# Patient Record
Sex: Female | Born: 1985 | Race: Black or African American | Hispanic: No | Marital: Single
Health system: Southern US, Community
[De-identification: ages and names within clinical notes are randomized; demographics above are authoritative.]

## PROBLEM LIST (undated history)

## (undated) DIAGNOSIS — E119 Type 2 diabetes mellitus without complications: Secondary | ICD-10-CM

---

## 2006-05-17 ENCOUNTER — Emergency Department (HOSPITAL_COMMUNITY): Admission: EM | Admit: 2006-05-17 | Discharge: 2006-05-18 | Payer: Self-pay | Admitting: Emergency Medicine

## 2010-01-14 ENCOUNTER — Inpatient Hospital Stay (HOSPITAL_COMMUNITY): Admission: AD | Admit: 2010-01-14 | Discharge: 2010-01-14 | Payer: Self-pay | Admitting: Obstetrics & Gynecology

## 2010-01-21 ENCOUNTER — Encounter
Admission: RE | Admit: 2010-01-21 | Discharge: 2010-04-21 | Payer: Self-pay | Source: Home / Self Care | Admitting: Obstetrics & Gynecology

## 2010-01-21 ENCOUNTER — Ambulatory Visit: Payer: Self-pay | Admitting: Obstetrics & Gynecology

## 2010-01-21 LAB — CONVERTED CEMR LAB: Yeast Wet Prep HPF POC: NONE SEEN

## 2010-01-25 ENCOUNTER — Ambulatory Visit: Payer: Self-pay | Admitting: Obstetrics & Gynecology

## 2010-01-25 LAB — CONVERTED CEMR LAB
ALT: 11 units/L (ref 0–35)
Alkaline Phosphatase: 57 units/L (ref 39–117)
Antibody Screen: NEGATIVE
Collection Interval-CRCL: 24 hr
Creatinine Clearance: 286 mL/min — ABNORMAL HIGH (ref 75–115)
Creatinine, Ser: 0.55 mg/dL (ref 0.40–1.20)
Creatinine, Urine: 137.3 mg/dL
Eosinophils Absolute: 0.1 10*3/uL (ref 0.0–0.7)
Eosinophils Relative: 1 % (ref 0–5)
HCT: 35.1 % — ABNORMAL LOW (ref 36.0–46.0)
Hemoglobin: 11.7 g/dL — ABNORMAL LOW (ref 12.0–15.0)
Hgb F Quant: 0 % (ref 0.0–2.0)
Hgb S Quant: 0 % (ref 0.0–0.0)
Lymphocytes Relative: 19 % (ref 12–46)
Lymphs Abs: 1.7 10*3/uL (ref 0.7–4.0)
MCV: 81.4 fL (ref 78.0–100.0)
Monocytes Absolute: 0.6 10*3/uL (ref 0.1–1.0)
Monocytes Relative: 6 % (ref 3–12)
Platelets: 293 10*3/uL (ref 150–400)
Protein, Ur: 83 mg/24hr (ref 50–100)
RBC: 4.31 M/uL (ref 3.87–5.11)
Rh Type: POSITIVE
Rubella: 39.2 intl units/mL — ABNORMAL HIGH
Sodium: 136 meq/L (ref 135–145)
Total Bilirubin: 0.4 mg/dL (ref 0.3–1.2)
Total Protein: 6.4 g/dL (ref 6.0–8.3)
WBC: 8.8 10*3/uL (ref 4.0–10.5)

## 2010-01-28 ENCOUNTER — Ambulatory Visit: Payer: Self-pay | Admitting: Obstetrics & Gynecology

## 2010-01-31 ENCOUNTER — Ambulatory Visit (HOSPITAL_COMMUNITY): Admission: RE | Admit: 2010-01-31 | Discharge: 2010-01-31 | Payer: Self-pay | Admitting: Obstetrics & Gynecology

## 2010-02-11 ENCOUNTER — Ambulatory Visit: Payer: Self-pay | Admitting: Obstetrics & Gynecology

## 2010-02-25 ENCOUNTER — Ambulatory Visit: Payer: Self-pay | Admitting: Obstetrics & Gynecology

## 2010-03-11 ENCOUNTER — Ambulatory Visit: Payer: Self-pay | Admitting: Obstetrics & Gynecology

## 2010-03-12 ENCOUNTER — Encounter: Payer: Self-pay | Admitting: Family

## 2010-03-21 ENCOUNTER — Ambulatory Visit (HOSPITAL_COMMUNITY): Admission: RE | Admit: 2010-03-21 | Discharge: 2010-03-21 | Payer: Self-pay | Admitting: Obstetrics & Gynecology

## 2010-03-28 ENCOUNTER — Ambulatory Visit: Payer: Self-pay | Admitting: Family Medicine

## 2010-04-11 ENCOUNTER — Ambulatory Visit: Payer: Self-pay | Admitting: Family Medicine

## 2010-04-22 ENCOUNTER — Ambulatory Visit (HOSPITAL_COMMUNITY): Admission: RE | Admit: 2010-04-22 | Discharge: 2010-04-22 | Payer: Self-pay | Admitting: Obstetrics & Gynecology

## 2010-05-09 ENCOUNTER — Ambulatory Visit: Payer: Self-pay | Admitting: Obstetrics & Gynecology

## 2010-05-21 ENCOUNTER — Ambulatory Visit: Payer: Self-pay | Admitting: Family Medicine

## 2010-05-21 ENCOUNTER — Inpatient Hospital Stay (HOSPITAL_COMMUNITY): Admission: AD | Admit: 2010-05-21 | Discharge: 2010-05-22 | Payer: Self-pay | Admitting: Obstetrics & Gynecology

## 2010-05-23 ENCOUNTER — Ambulatory Visit: Payer: Self-pay | Admitting: Obstetrics & Gynecology

## 2010-06-03 ENCOUNTER — Ambulatory Visit (HOSPITAL_COMMUNITY): Admission: RE | Admit: 2010-06-03 | Discharge: 2010-06-03 | Payer: Self-pay | Admitting: Obstetrics & Gynecology

## 2010-06-06 ENCOUNTER — Encounter: Payer: Self-pay | Admitting: Obstetrics and Gynecology

## 2010-06-06 ENCOUNTER — Ambulatory Visit: Payer: Self-pay | Admitting: Family Medicine

## 2010-06-06 LAB — CONVERTED CEMR LAB
HCT: 34.4 % — ABNORMAL LOW (ref 36.0–46.0)
Hemoglobin: 11.3 g/dL — ABNORMAL LOW (ref 12.0–15.0)
RBC: 4.23 M/uL (ref 3.87–5.11)
RDW: 14.6 % (ref 11.5–15.5)

## 2010-06-17 ENCOUNTER — Ambulatory Visit: Payer: Self-pay | Admitting: Obstetrics & Gynecology

## 2010-06-24 ENCOUNTER — Ambulatory Visit: Payer: Self-pay | Admitting: Obstetrics & Gynecology

## 2010-07-01 ENCOUNTER — Ambulatory Visit: Payer: Self-pay | Admitting: Obstetrics and Gynecology

## 2010-07-01 ENCOUNTER — Encounter: Admission: RE | Admit: 2010-07-01 | Discharge: 2010-07-01 | Payer: Self-pay | Admitting: Obstetrics & Gynecology

## 2010-07-04 ENCOUNTER — Ambulatory Visit: Payer: Self-pay | Admitting: Obstetrics and Gynecology

## 2010-07-08 ENCOUNTER — Ambulatory Visit: Payer: Self-pay | Admitting: Obstetrics & Gynecology

## 2010-07-11 ENCOUNTER — Ambulatory Visit: Payer: Self-pay | Admitting: Obstetrics & Gynecology

## 2010-07-15 ENCOUNTER — Ambulatory Visit: Payer: Self-pay | Admitting: Obstetrics & Gynecology

## 2010-07-15 ENCOUNTER — Ambulatory Visit (HOSPITAL_COMMUNITY): Admission: RE | Admit: 2010-07-15 | Discharge: 2010-07-15 | Payer: Self-pay | Admitting: Obstetrics & Gynecology

## 2010-07-18 ENCOUNTER — Ambulatory Visit: Payer: Self-pay | Admitting: Obstetrics and Gynecology

## 2010-07-22 ENCOUNTER — Encounter: Payer: Self-pay | Admitting: Family Medicine

## 2010-07-22 ENCOUNTER — Ambulatory Visit: Payer: Self-pay | Admitting: Obstetrics & Gynecology

## 2010-07-23 ENCOUNTER — Encounter: Payer: Self-pay | Admitting: Family Medicine

## 2010-07-23 LAB — CONVERTED CEMR LAB: Chlamydia, DNA Probe: NEGATIVE

## 2010-07-25 ENCOUNTER — Ambulatory Visit: Payer: Self-pay | Admitting: Obstetrics and Gynecology

## 2010-07-29 ENCOUNTER — Ambulatory Visit: Payer: Self-pay | Admitting: Obstetrics & Gynecology

## 2010-08-01 ENCOUNTER — Ambulatory Visit: Payer: Self-pay | Admitting: Obstetrics and Gynecology

## 2010-08-06 ENCOUNTER — Ambulatory Visit: Payer: Self-pay | Admitting: Obstetrics & Gynecology

## 2010-08-06 ENCOUNTER — Ambulatory Visit (HOSPITAL_COMMUNITY): Admission: RE | Admit: 2010-08-06 | Discharge: 2010-08-06 | Payer: Self-pay | Admitting: Family Medicine

## 2010-08-09 ENCOUNTER — Ambulatory Visit: Payer: Self-pay | Admitting: Obstetrics and Gynecology

## 2010-08-11 ENCOUNTER — Ambulatory Visit: Payer: Self-pay | Admitting: Obstetrics & Gynecology

## 2010-08-11 ENCOUNTER — Inpatient Hospital Stay (HOSPITAL_COMMUNITY)
Admission: AD | Admit: 2010-08-11 | Discharge: 2010-08-14 | Payer: Self-pay | Source: Home / Self Care | Admitting: Obstetrics & Gynecology

## 2010-09-09 ENCOUNTER — Ambulatory Visit: Payer: Self-pay | Admitting: Obstetrics & Gynecology

## 2010-10-13 ENCOUNTER — Encounter: Payer: Self-pay | Admitting: Obstetrics & Gynecology

## 2010-10-31 ENCOUNTER — Ambulatory Visit: Payer: Self-pay

## 2010-12-03 LAB — GLUCOSE, CAPILLARY
Glucose-Capillary: 102 mg/dL — ABNORMAL HIGH (ref 70–99)
Glucose-Capillary: 109 mg/dL — ABNORMAL HIGH (ref 70–99)
Glucose-Capillary: 131 mg/dL — ABNORMAL HIGH (ref 70–99)
Glucose-Capillary: 134 mg/dL — ABNORMAL HIGH (ref 70–99)
Glucose-Capillary: 137 mg/dL — ABNORMAL HIGH (ref 70–99)
Glucose-Capillary: 160 mg/dL — ABNORMAL HIGH (ref 70–99)
Glucose-Capillary: 83 mg/dL (ref 70–99)
Glucose-Capillary: 94 mg/dL (ref 70–99)
Glucose-Capillary: 97 mg/dL (ref 70–99)

## 2010-12-03 LAB — POCT URINALYSIS DIPSTICK
Glucose, UA: NEGATIVE mg/dL
Ketones, ur: NEGATIVE mg/dL
Nitrite: NEGATIVE
pH: 7 (ref 5.0–8.0)

## 2010-12-03 LAB — CBC
HCT: 32.8 % — ABNORMAL LOW (ref 36.0–46.0)
Hemoglobin: 11.1 g/dL — ABNORMAL LOW (ref 12.0–15.0)
RBC: 4.02 MIL/uL (ref 3.87–5.11)
WBC: 8.6 10*3/uL (ref 4.0–10.5)

## 2010-12-03 LAB — RPR: RPR Ser Ql: NONREACTIVE

## 2010-12-04 LAB — POCT URINALYSIS DIPSTICK
Glucose, UA: NEGATIVE mg/dL
Hgb urine dipstick: NEGATIVE
Ketones, ur: 80 mg/dL — AB
Ketones, ur: NEGATIVE mg/dL
Nitrite: NEGATIVE
Specific Gravity, Urine: 1.02 (ref 1.005–1.030)
pH: 6 (ref 5.0–8.0)

## 2010-12-05 LAB — POCT URINALYSIS DIPSTICK
Bilirubin Urine: NEGATIVE
Bilirubin Urine: NEGATIVE
Glucose, UA: NEGATIVE mg/dL
Hgb urine dipstick: NEGATIVE
Hgb urine dipstick: NEGATIVE
Hgb urine dipstick: NEGATIVE
Ketones, ur: NEGATIVE mg/dL
Ketones, ur: NEGATIVE mg/dL
Ketones, ur: NEGATIVE mg/dL
Protein, ur: NEGATIVE mg/dL
Protein, ur: NEGATIVE mg/dL
Protein, ur: NEGATIVE mg/dL
Specific Gravity, Urine: 1.025 (ref 1.005–1.030)
Specific Gravity, Urine: 1.025 (ref 1.005–1.030)
Specific Gravity, Urine: 1.02 (ref 1.005–1.030)
Specific Gravity, Urine: 1.02 (ref 1.005–1.030)
Specific Gravity, Urine: 1.02 (ref 1.005–1.030)
Urobilinogen, UA: 1 mg/dL (ref 0.0–1.0)
Urobilinogen, UA: 1 mg/dL (ref 0.0–1.0)
pH: 6 (ref 5.0–8.0)
pH: 6.5 (ref 5.0–8.0)

## 2010-12-06 LAB — GC/CHLAMYDIA PROBE AMP, GENITAL
Chlamydia, DNA Probe: NEGATIVE
GC Probe Amp, Genital: NEGATIVE

## 2010-12-06 LAB — URINE CULTURE: Culture  Setup Time: 201108310532

## 2010-12-06 LAB — URINALYSIS, ROUTINE W REFLEX MICROSCOPIC
Bilirubin Urine: NEGATIVE
Glucose, UA: NEGATIVE mg/dL
Specific Gravity, Urine: >1.03 — ABNORMAL HIGH (ref 1.005–1.030)
Urobilinogen, UA: 0.2 mg/dL (ref 0.0–1.0)

## 2010-12-06 LAB — URINE MICROSCOPIC-ADD ON

## 2010-12-06 LAB — POCT URINALYSIS DIPSTICK
Nitrite: NEGATIVE
Urobilinogen, UA: 2 mg/dL — ABNORMAL HIGH (ref 0.0–1.0)
pH: 6 (ref 5.0–8.0)

## 2010-12-06 LAB — GLUCOSE, CAPILLARY: Glucose-Capillary: 87 mg/dL (ref 70–99)

## 2010-12-06 LAB — WET PREP, GENITAL
Trich, Wet Prep: NONE SEEN
Yeast Wet Prep HPF POC: NONE SEEN

## 2010-12-07 LAB — POCT URINALYSIS DIP (DEVICE)
Hgb urine dipstick: NEGATIVE
Nitrite: NEGATIVE
Protein, ur: NEGATIVE mg/dL
Urobilinogen, UA: 2 mg/dL — ABNORMAL HIGH (ref 0.0–1.0)
pH: 7.5 (ref 5.0–8.0)

## 2010-12-08 LAB — POCT URINALYSIS DIP (DEVICE)
Glucose, UA: NEGATIVE mg/dL
Nitrite: NEGATIVE
Nitrite: NEGATIVE
Protein, ur: NEGATIVE mg/dL
Protein, ur: 30 mg/dL — AB
Urobilinogen, UA: 1 mg/dL (ref 0.0–1.0)
Urobilinogen, UA: 0.2 mg/dL (ref 0.0–1.0)
pH: 7 (ref 5.0–8.0)

## 2010-12-09 LAB — POCT URINALYSIS DIP (DEVICE)
Bilirubin Urine: NEGATIVE
Bilirubin Urine: NEGATIVE
Glucose, UA: NEGATIVE mg/dL
Ketones, ur: NEGATIVE mg/dL
Nitrite: NEGATIVE
Nitrite: NEGATIVE
Protein, ur: 30 mg/dL — AB
Urobilinogen, UA: 1 mg/dL (ref 0.0–1.0)
pH: 5.5 (ref 5.0–8.0)
pH: 5.5 (ref 5.0–8.0)

## 2010-12-10 LAB — COMPREHENSIVE METABOLIC PANEL
ALT: 12 U/L (ref 0–35)
BUN: 6 mg/dL (ref 6–23)
CO2: 24 mEq/L (ref 19–32)
Calcium: 8.9 mg/dL (ref 8.4–10.5)
Creatinine, Ser: 0.57 mg/dL (ref 0.4–1.2)
GFR calc non Af Amer: >60 mL/min (ref >60)
Glucose, Bld: 122 mg/dL — ABNORMAL HIGH (ref 70–99)

## 2010-12-10 LAB — GLUCOSE, CAPILLARY
Glucose-Capillary: 75 mg/dL (ref 70–99)
Glucose-Capillary: 149 mg/dL — ABNORMAL HIGH (ref 70–99)

## 2010-12-10 LAB — CBC
Hemoglobin: 12.1 g/dL (ref 12.0–15.0)
MCHC: 34.5 g/dL (ref 30.0–36.0)
MCV: 83 fL (ref 78.0–100.0)
RBC: 4.21 MIL/uL (ref 3.87–5.11)
RDW: 13.9 % (ref 11.5–15.5)

## 2010-12-10 LAB — POCT URINALYSIS DIP (DEVICE)
Glucose, UA: NEGATIVE mg/dL
Glucose, UA: NEGATIVE mg/dL
Nitrite: NEGATIVE
Protein, ur: NEGATIVE mg/dL
Protein, ur: NEGATIVE mg/dL
Specific Gravity, Urine: 1.02 (ref 1.005–1.030)
Urobilinogen, UA: 1 mg/dL (ref 0.0–1.0)
Urobilinogen, UA: 1 mg/dL (ref 0.0–1.0)

## 2010-12-10 LAB — URINALYSIS, ROUTINE W REFLEX MICROSCOPIC
Ketones, ur: NEGATIVE mg/dL
Nitrite: NEGATIVE
Protein, ur: NEGATIVE mg/dL
Urobilinogen, UA: 1 mg/dL (ref 0.0–1.0)

## 2010-12-10 LAB — URINE MICROSCOPIC-ADD ON

## 2011-03-06 ENCOUNTER — Ambulatory Visit: Payer: Self-pay | Admitting: Physician Assistant

## 2012-04-04 IMAGING — US US OB COMP LESS 14 WK
1 series · 14 of 28 positions shown · non-contrast
Comparison: none

OBSTETRICAL ULTRASOUND:
 This ultrasound exam was performed in the [HOSPITAL] Ultrasound Department.  The OB US report was generated in the AS system, and faxed to the ordering physician.  This report is also available in [HOSPITAL]?s Access[employer]ware and in [REDACTED] PACS.

[Series 1: us ob comp less 14 wks · 0.17mm/px · 14 of 31 slices shown]
[im 2/31]
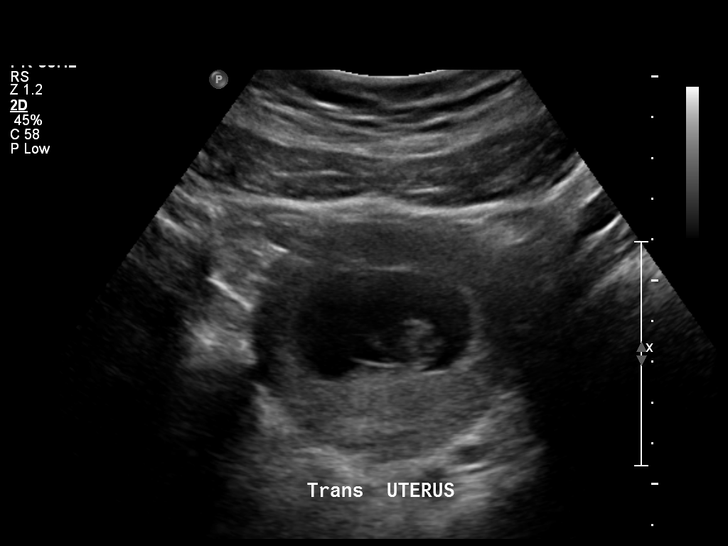
[im 4/31]
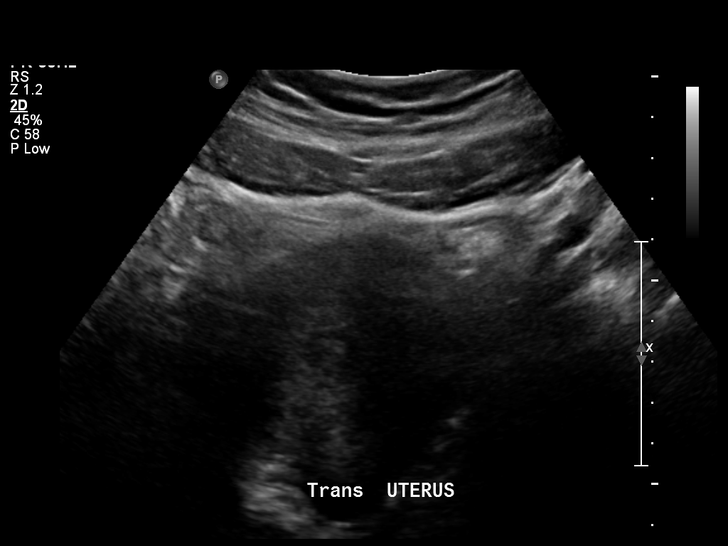
[im 6/31]
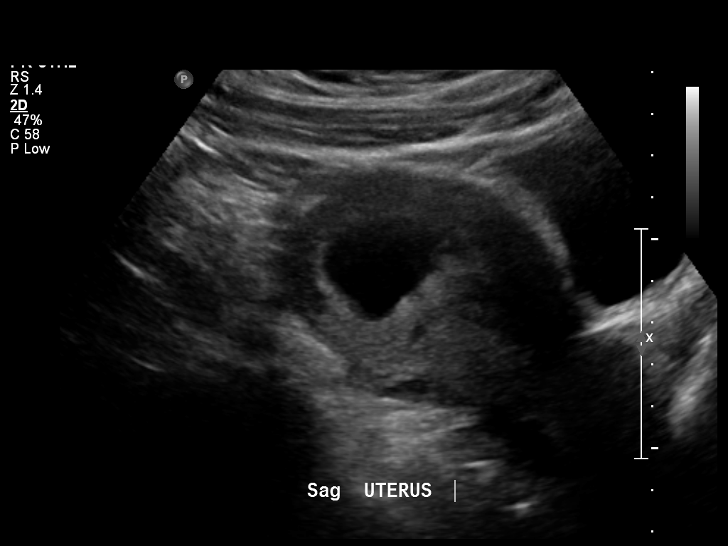
[im 8/31]
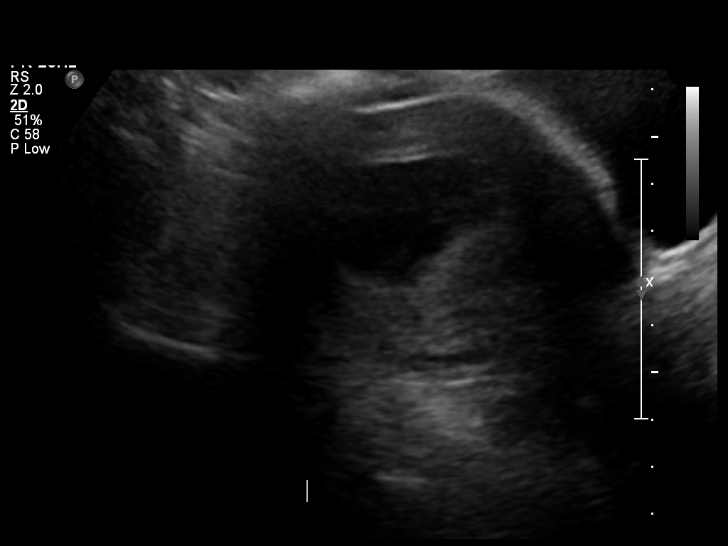
[im 11/31]
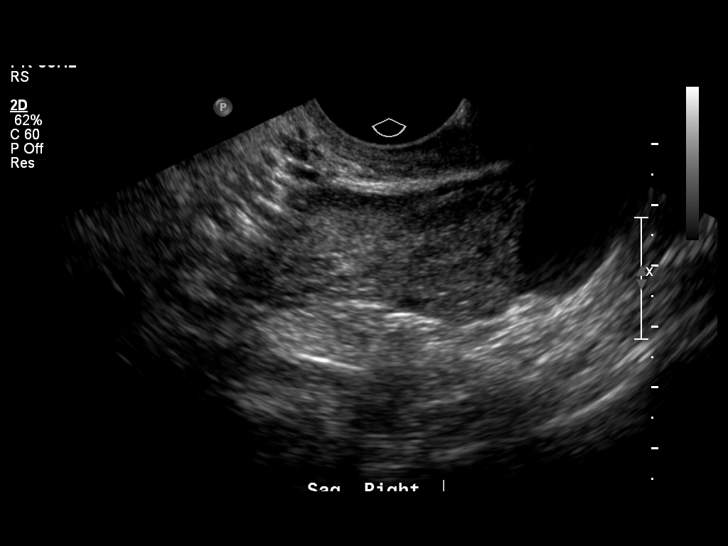
[im 13/31]
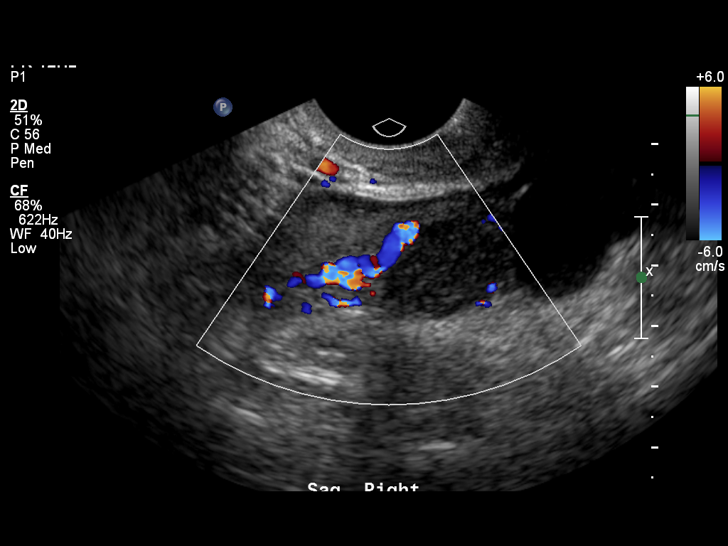
[im 15/31]
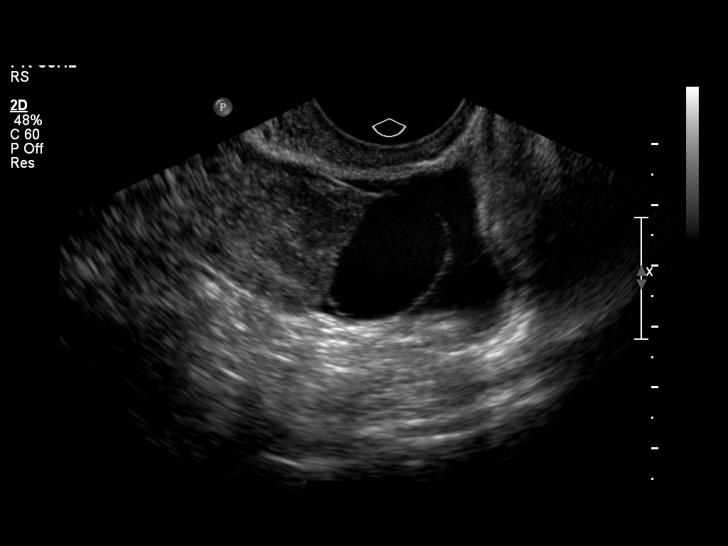
[im 17/31]
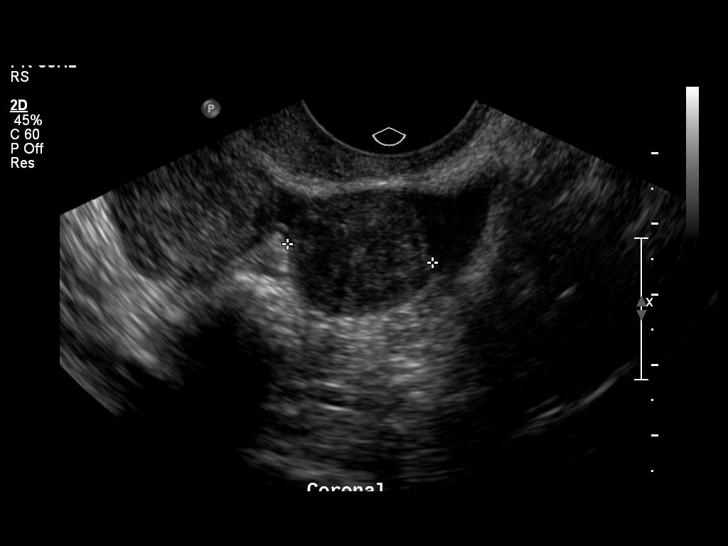
[im 19/31]
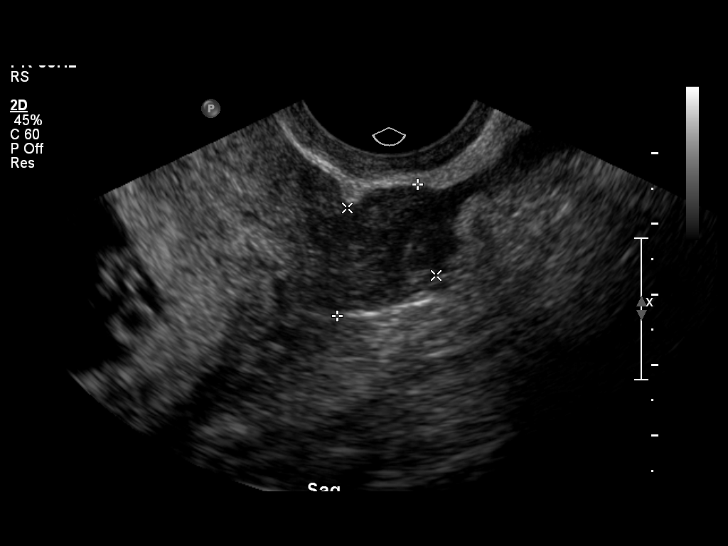
[im 22/31]
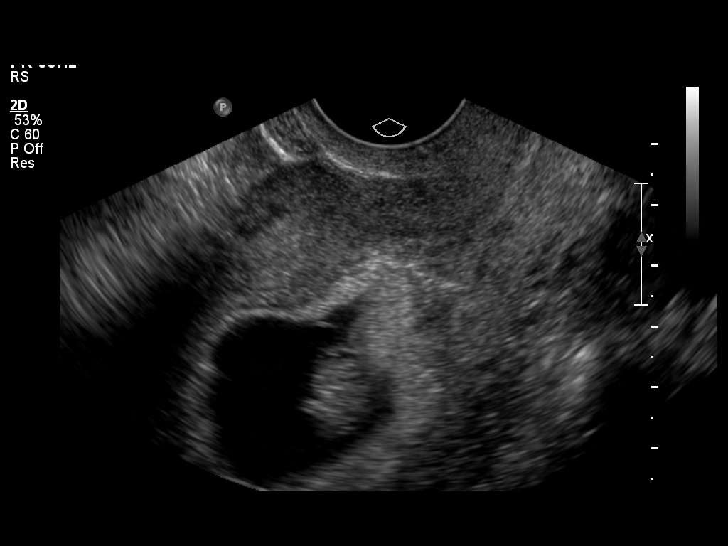
[im 24/31]
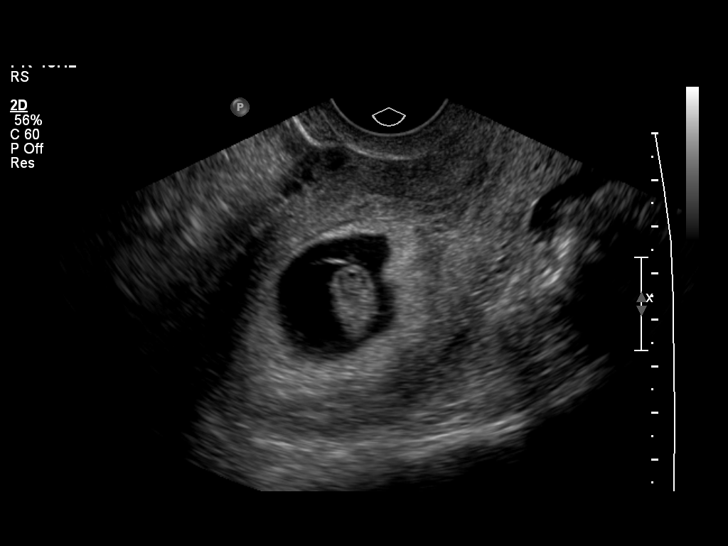
[im 26/31]
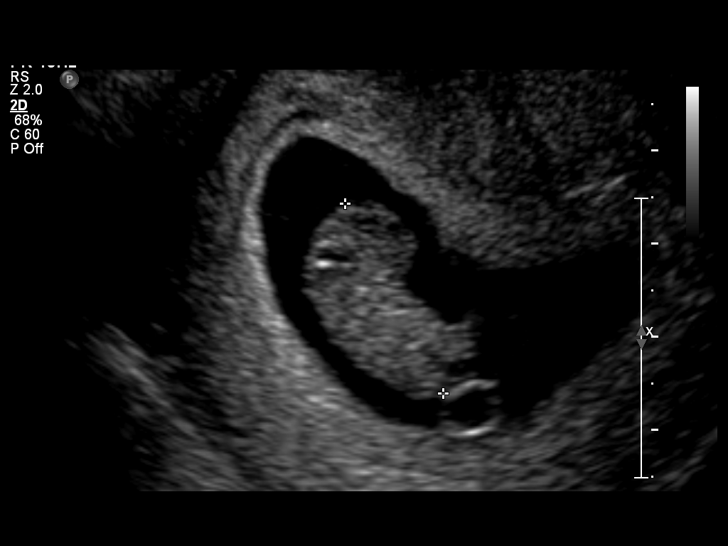
[im 28/31]
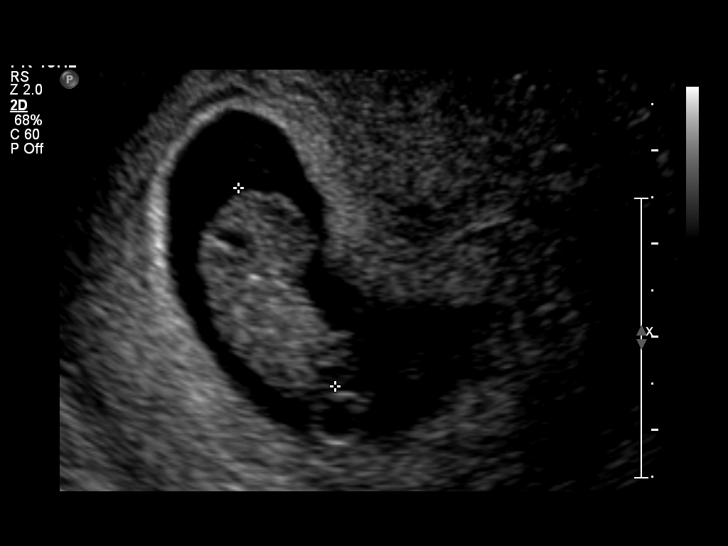
[im 31/31]
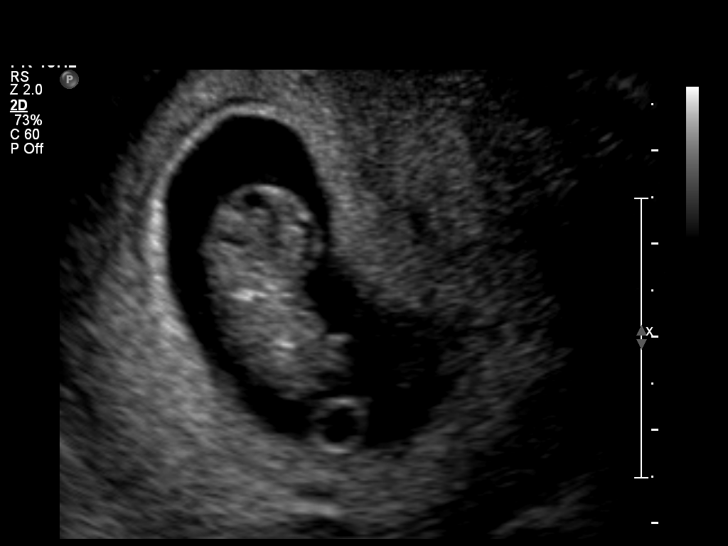

[14 of 28 positions shown; findings below may reference images not displayed]

IMPRESSION: See AS Obstetric US report.

## 2012-07-11 IMAGING — US US OB FOLLOW-UP
2 series · 14 of 28 positions shown · non-contrast
Comparison: none

OBSTETRICAL ULTRASOUND:
 This ultrasound was performed in The [HOSPITAL], and the [facility] OB/GYN report will be stored to [REDACTED] PACS.  This report is also available in [HOSPITAL]?s accessANYware.

[Series 1: us ob follow-up · 3 of 14 slices shown (1 of 2)]
[im 3/14]
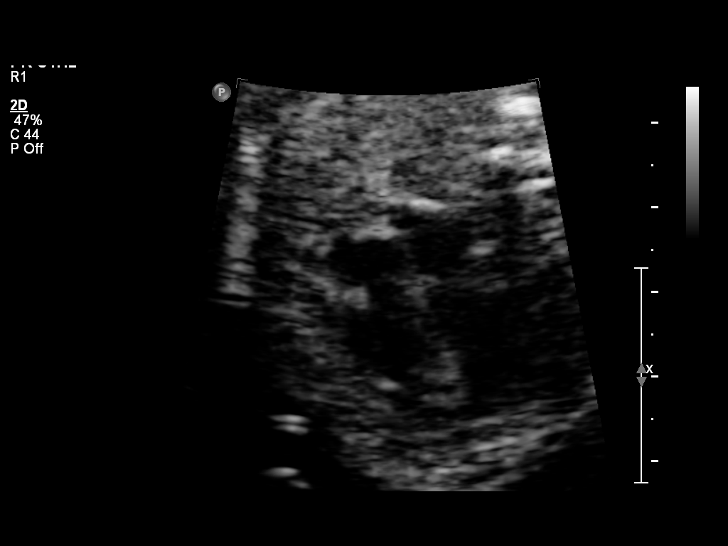
[im 7/14]
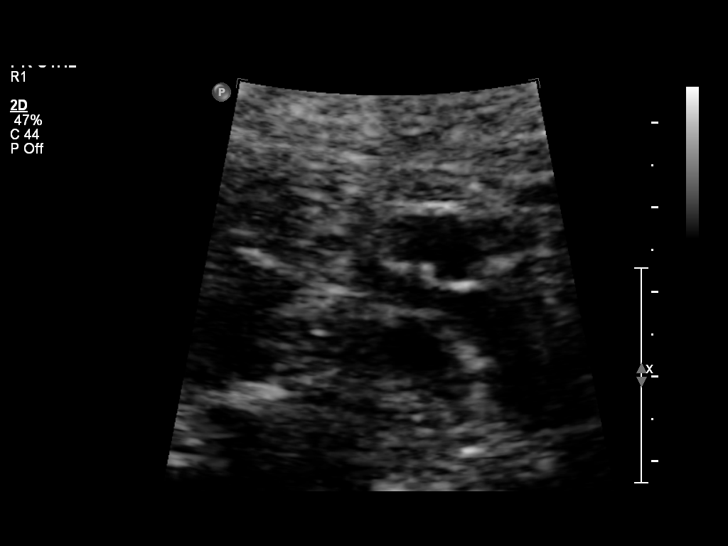
[im 11/14]
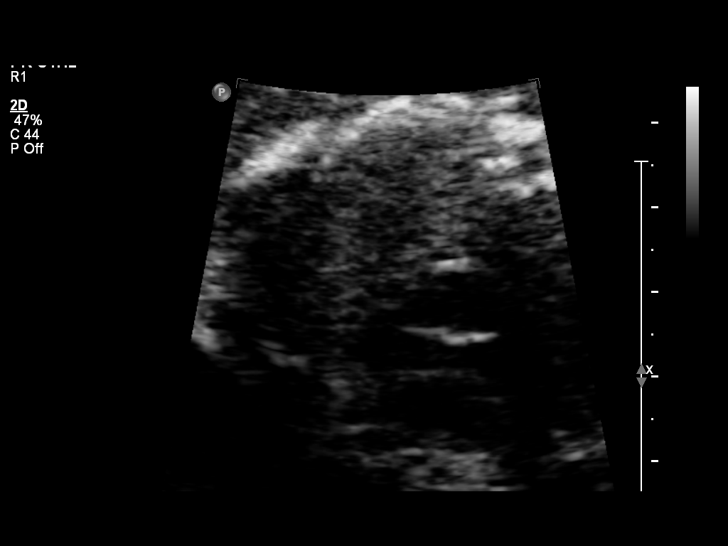

[Series 1: us ob follow-up · 39 acquisitions, 11 frames shown (2 of 2)]
[im 1/39]
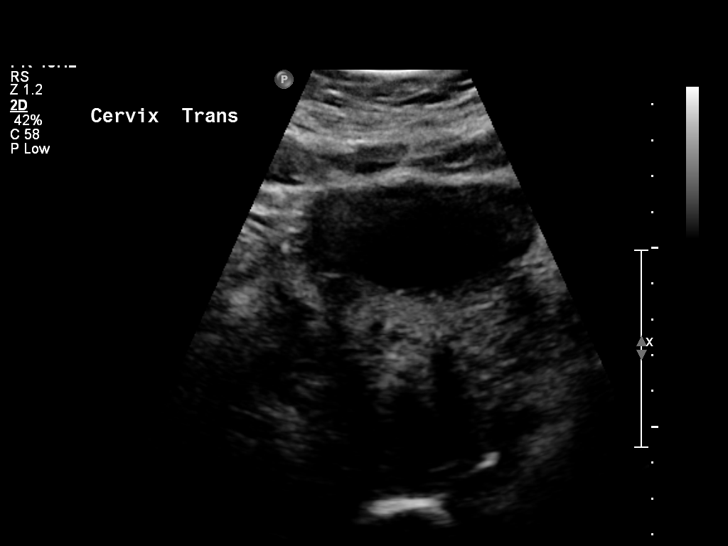
[im 4/39]
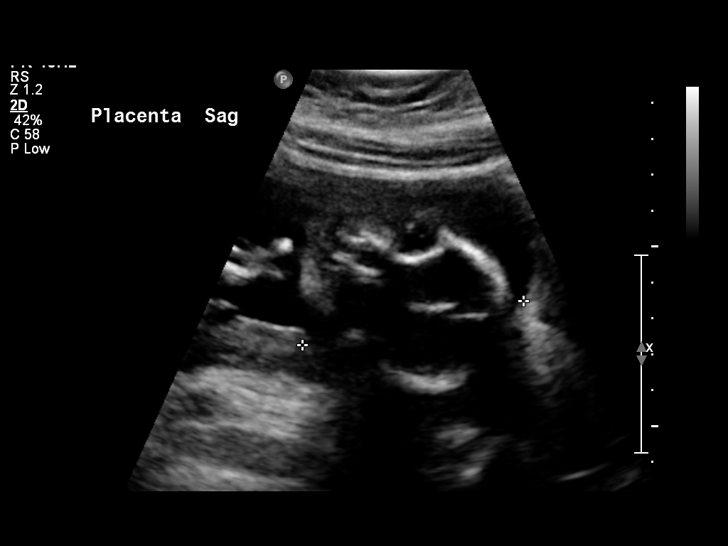
[im 8/39]
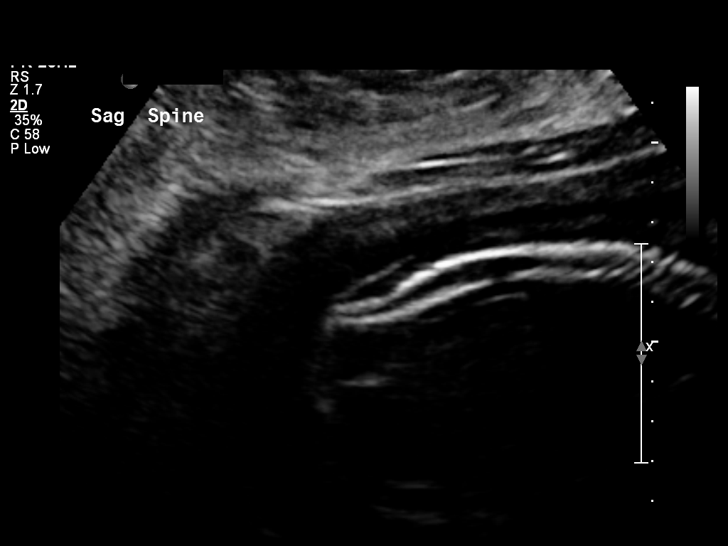
[im 12/39]
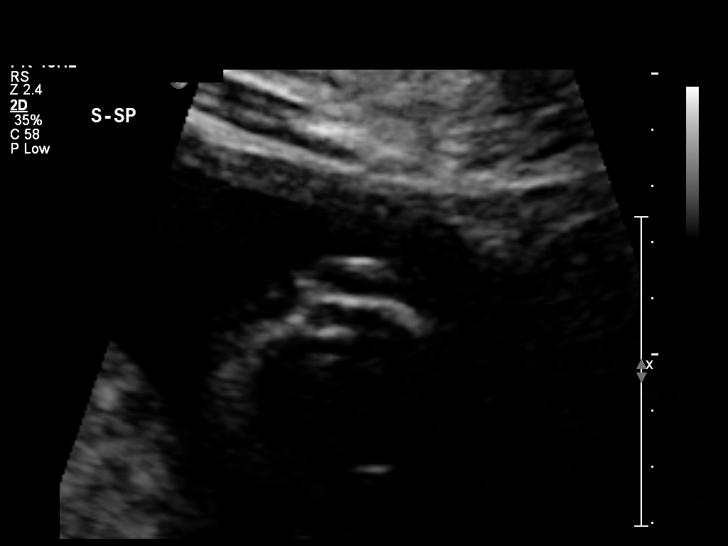
[im 16/39]
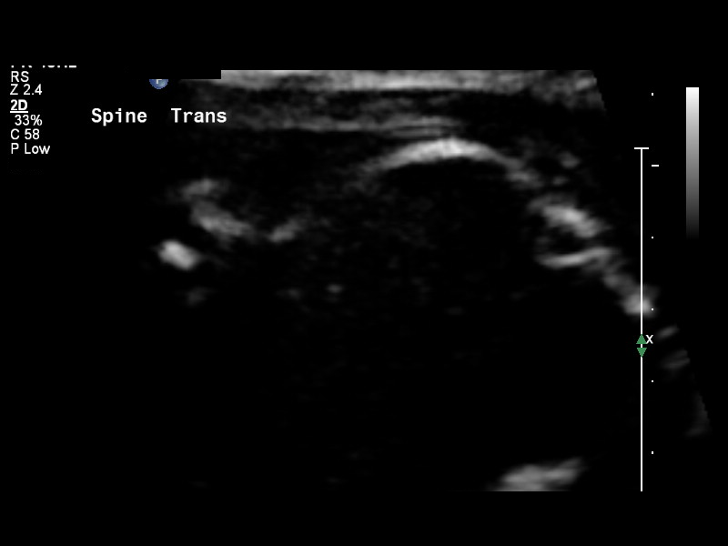
[im 20/39]
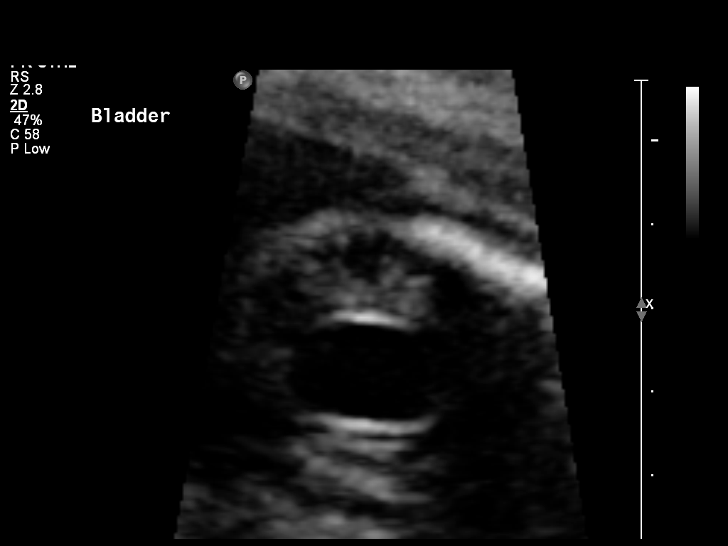
[im 23/39]
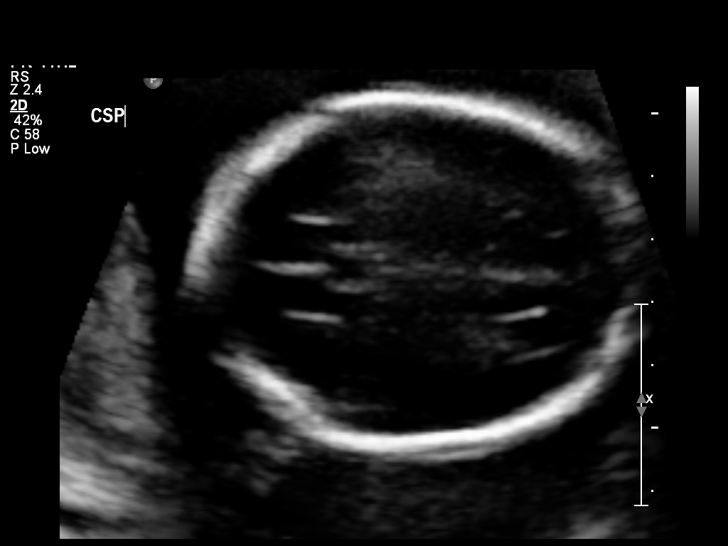
[im 27/39]
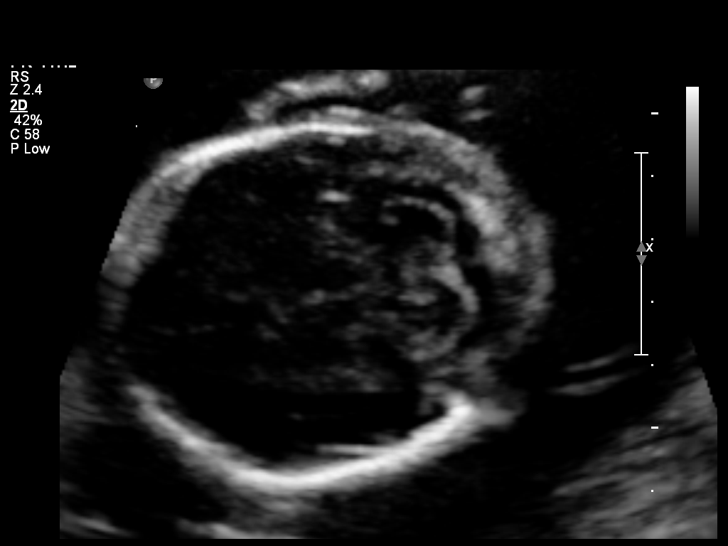
[im 31/39]
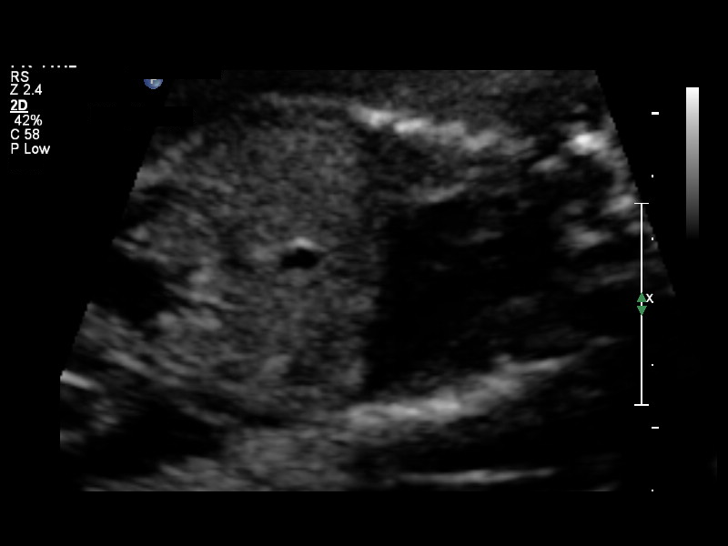
[im 35/39]
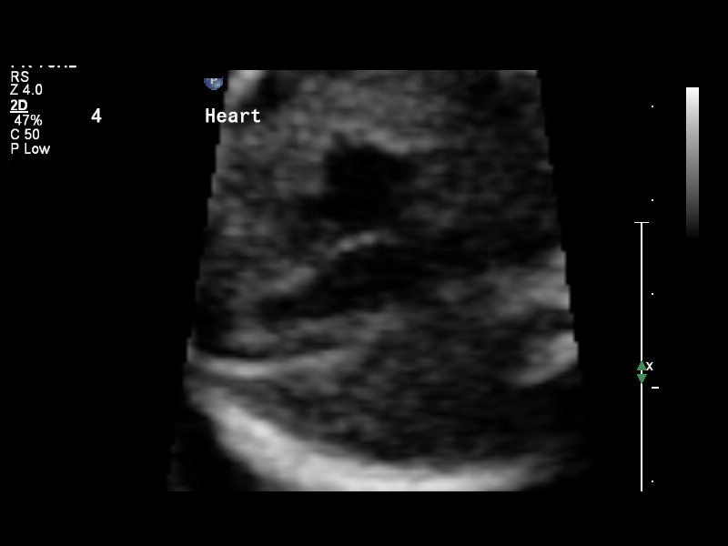
[im 39/39]
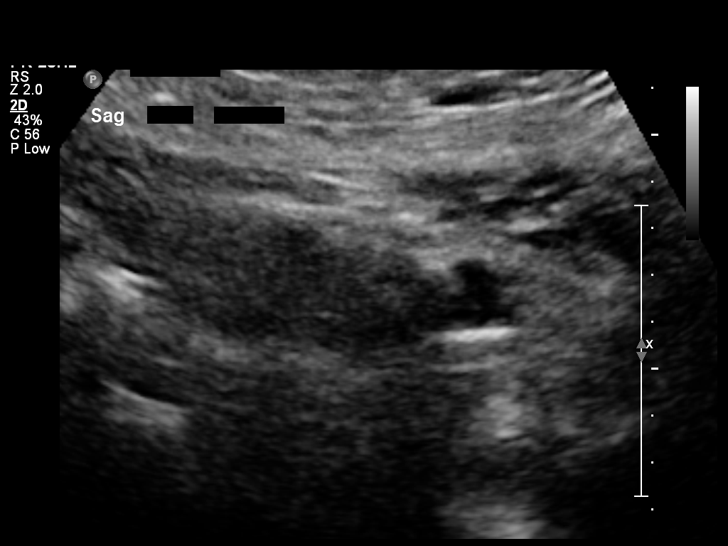

[14 of 28 positions shown; findings below may reference images not displayed]

IMPRESSION: [facility] has also been faxed to the ordering physician.

## 2013-03-13 ENCOUNTER — Emergency Department (HOSPITAL_COMMUNITY): Payer: BC Managed Care – PPO

## 2013-03-13 ENCOUNTER — Emergency Department (HOSPITAL_COMMUNITY)
Admission: EM | Admit: 2013-03-13 | Discharge: 2013-03-13 | Disposition: A | Discharge status: Home / Self Care | Payer: BC Managed Care – PPO | Attending: Emergency Medicine | Admitting: Emergency Medicine

## 2013-03-13 ENCOUNTER — Encounter (HOSPITAL_COMMUNITY): Payer: Self-pay | Admitting: Emergency Medicine

## 2013-03-13 DIAGNOSIS — Z79899 Other long term (current) drug therapy: Secondary | ICD-10-CM | POA: Insufficient documentation

## 2013-03-13 DIAGNOSIS — E669 Obesity, unspecified: Secondary | ICD-10-CM | POA: Insufficient documentation

## 2013-03-13 DIAGNOSIS — I10 Essential (primary) hypertension: Secondary | ICD-10-CM | POA: Insufficient documentation

## 2013-03-13 DIAGNOSIS — M545 Low back pain, unspecified: Secondary | ICD-10-CM | POA: Insufficient documentation

## 2013-03-13 DIAGNOSIS — R209 Unspecified disturbances of skin sensation: Secondary | ICD-10-CM | POA: Insufficient documentation

## 2013-03-13 DIAGNOSIS — E119 Type 2 diabetes mellitus without complications: Secondary | ICD-10-CM | POA: Insufficient documentation

## 2013-03-13 DIAGNOSIS — Z3202 Encounter for pregnancy test, result negative: Secondary | ICD-10-CM | POA: Insufficient documentation

## 2013-03-13 HISTORY — DX: Type 2 diabetes mellitus without complications: E11.9

## 2013-03-13 LAB — URINALYSIS, ROUTINE W REFLEX MICROSCOPIC
Bilirubin Urine: NEGATIVE
Protein, ur: 30 mg/dL — AB
Specific Gravity, Urine: 1.029 (ref 1.005–1.030)
Urobilinogen, UA: 1 mg/dL (ref 0.0–1.0)
pH: 6 (ref 5.0–8.0)

## 2013-03-13 LAB — URINE MICROSCOPIC-ADD ON

## 2013-03-13 LAB — GLUCOSE, CAPILLARY: Glucose-Capillary: 196 mg/dL — ABNORMAL HIGH (ref 70–99)

## 2013-03-13 MED ORDER — IBUPROFEN 400 MG PO TABS
400.0000 mg | ORAL_TABLET | Freq: Once | ORAL | Status: AC
  Administered 2013-03-13: 400 mg via ORAL
  Filled 2013-03-13: qty 1

## 2013-03-13 MED ORDER — IBUPROFEN 600 MG PO TABS: 600.0000 mg | ORAL_TABLET | Freq: Four times a day (QID) | ORAL | Status: AC | PRN

## 2013-03-13 MED ORDER — TRAMADOL HCL 50 MG PO TABS
50.0000 mg | ORAL_TABLET | Freq: Once | ORAL | Status: AC
  Administered 2013-03-13: 50 mg via ORAL
  Filled 2013-03-13: qty 1

## 2013-03-13 MED ORDER — METFORMIN HCL 500 MG PO TABS: 500.0000 mg | ORAL_TABLET | Freq: Two times a day (BID) | ORAL | Status: DC

## 2013-03-13 MED ORDER — TRAMADOL HCL 50 MG PO TABS: 50.0000 mg | ORAL_TABLET | Freq: Four times a day (QID) | ORAL | Status: AC | PRN

## 2013-03-13 MED ORDER — LISINOPRIL 20 MG PO TABS: 10.0000 mg | ORAL_TABLET | Freq: Every day | ORAL | Status: AC

## 2013-03-13 NOTE — ED Notes (Signed)
Pt has returned from Radiology.  

## 2013-03-13 NOTE — ED Notes (Signed)
Patient with hypertension, having bilateral gluteal pain.  Patient states pain increased, knees went week and patient lowered herself to the ground.  Patient denies any dizziness, no lightheadedness, no LOC, no syncope.  Patient is CAOx3.

## 2013-03-13 NOTE — ED Provider Notes (Signed)
History     CSN: [id number]  Arrival date & time 03/13/13  2124   First MD Initiated Contact with Patient 03/13/13 2140      Chief Complaint  Patient presents with  . Hypertension  . gluteal pain     (Consider location/radiation/quality/duration/timing/severity/associated sxs/prior treatment) HPI Pt p/w low back pain for several days that acutely worsened this evening. Pt states she experienced bl the knee numbness which has since resolved. No focal weakness, urinary symptoms, fever chills. No prev trauma or issue with back. Past Medical History  Diagnosis Date  . Diabetes mellitus without complication     History reviewed. No pertinent past surgical history.  No family history on file.  History  Substance Use Topics  . Smoking status: Not on file  . Smokeless tobacco: Not on file  . Alcohol Use: Not on file    OB History   Grav Para Term Preterm Abortions TAB SAB Ect Mult Living                  Review of Systems  Constitutional: Negative for fever and chills.  HENT: Negative for neck pain and neck stiffness.   Respiratory: Negative for cough and shortness of breath.   Cardiovascular: Negative for chest pain.  Gastrointestinal: Negative for nausea, vomiting and abdominal pain.  Genitourinary: Negative for dysuria, frequency, flank pain, difficulty urinating and pelvic pain.  Musculoskeletal: Positive for back pain.  Skin: Negative for rash and wound.  Neurological: Positive for numbness. Negative for dizziness, weakness, light-headedness and headaches.  All other systems reviewed and are negative.    Allergies  Review of patient's allergies indicates no known allergies.  Home Medications   Current Outpatient Rx  Name  Route  Sig  Dispense  Refill  . metFORMIN (GLUCOPHAGE) 500 MG tablet   Oral   Take 500 mg by mouth 2 (two) times daily with a meal.         . ibuprofen (ADVIL,MOTRIN) 600 MG tablet   Oral   Take 1 tablet (600 mg total) by mouth every  6 (six) hours as needed for pain.   30 tablet   0   . lisinopril (PRINIVIL,ZESTRIL) 20 MG tablet   Oral   Take 0.5 tablets (10 mg total) by mouth daily.   30 tablet   0   . metFORMIN (GLUCOPHAGE) 500 MG tablet   Oral   Take 1 tablet (500 mg total) by mouth 2 (two) times daily with a meal.   60 tablet   0   . traMADol (ULTRAM) 50 MG tablet   Oral   Take 1 tablet (50 mg total) by mouth every 6 (six) hours as needed for pain.   15 tablet   0     BP 161/97  Pulse 77  Temp(Src) 98.5 F (36.9 C) (Oral)  Resp 18  SpO2 97%  LMP 03/13/2013  Physical Exam  Nursing note and vitals reviewed. Constitutional: She is oriented to person, place, and time. She appears well-developed and well-nourished. No distress.  obese  HENT:  Head: Normocephalic and atraumatic.  Mouth/Throat: Oropharynx is clear and moist.  Eyes: EOM are normal. Pupils are equal, round, and reactive to light.  Neck: Normal range of motion. Neck supple.  Cardiovascular: Normal rate and regular rhythm.   Pulmonary/Chest: Effort normal and breath sounds normal. No respiratory distress. She has no wheezes (mild low lumbar, sacral midline TTP). She has no rales.  Abdominal: Soft. Bowel sounds are normal. She exhibits no  distension and no mass. There is no tenderness. There is no rebound and no guarding.  Musculoskeletal: Normal range of motion. She exhibits tenderness. She exhibits no edema.  Neg straight leg raise bl. No calf tenderness or swelling  Neurological: She is alert and oriented to person, place, and time.  5/5 motor in all ext, sensation intact. No saddle anesthesia  Skin: Skin is warm and dry. No rash noted. No erythema.  Psychiatric: She has a normal mood and affect. Her behavior is normal.    ED Course  Procedures (including critical care time)  Labs Reviewed  URINALYSIS, ROUTINE W REFLEX MICROSCOPIC - Abnormal; Notable for the following:    Color, Urine AMBER (*)    APPearance CLOUDY (*)    Hgb  urine dipstick LARGE (*)    Ketones, ur 15 (*)    Protein, ur 30 (*)    Leukocytes, UA SMALL (*)    All other components within normal limits  URINE MICROSCOPIC-ADD ON - Abnormal; Notable for the following:    Squamous Epithelial / LPF FEW (*)    Bacteria, UA FEW (*)    All other components within normal limits  GLUCOSE, CAPILLARY - Abnormal; Notable for the following:    Glucose-Capillary 196 (*)    All other components within normal limits  PREGNANCY, URINE   Dg Sacrum/coccyx  03/13/2013   *RADIOLOGY REPORT*  Clinical Data: Sacral pain  SACRUM AND COCCYX - 2+ VIEW  Comparison: None.  Findings: Negative for fracture or mass.  SI joints are intact. There is stool in the rectum overlying the sacrum.  IMPRESSION: Negative   Original Report Authenticated By: Janeece Riggers, M.D.   Ct Lumbar Spine Wo Contrast  03/13/2013   *RADIOLOGY REPORT*  Clinical Data: Low back pain  CT LUMBAR SPINE WITHOUT CONTRAST  Technique:  Multidetector CT imaging of the lumbar spine was performed without intravenous contrast administration. Multiplanar CT image reconstructions were also generated.  Comparison: None.  Findings: Normal alignment.  No fracture or mass lesion.  Disc spaces are maintained.  Image quality is suboptimal due to large patient size.  The study is not adequate to evaluate for disc protrusion or spinal stenosis. MRI may be helpful if the patient has radiculopathy.  No significant degenerative change or spurring.  SI joints are intact.  IMPRESSION: No acute bony abnormality.  MRI suggested if there is concern of disc disease.   Original Report Authenticated By: Janeece Riggers, M.D.     1. Low back pain       MDM   Negative imaging. No back pain red flags. D/c home with pain control. Weight loss encouraged       Loren Racer, MD 03/15/13 [phone]

## 2015-06-02 IMAGING — CT CT L SPINE W/O CM
3 series · 16 of 33 positions shown, 19 images · non-contrast
Comparison: None.

CLINICAL DATA: Low back pain

CT LUMBAR SPINE WITHOUT CONTRAST
TECHNIQUE: Multidetector CT imaging of the lumbar spine was
performed without intravenous contrast administration. Multiplanar
CT image reconstructions were also generated.

[Series 4: 2mm axial soft tissue · axial · 0.41mm/px · z∈[-315,-111]mm · 8 of 122 slices shown, 10 images]
[im 10/122  soft-tissue]
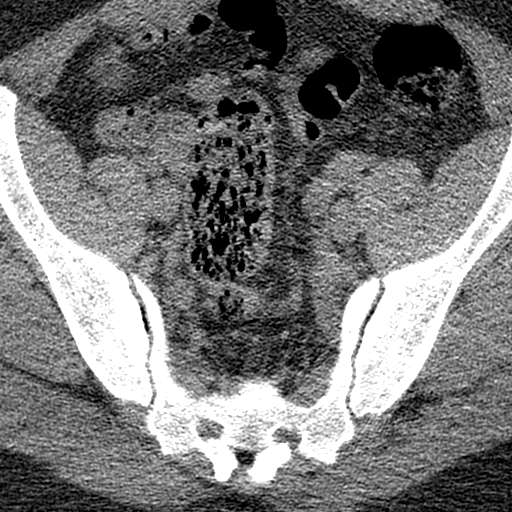
[im 10/122  bone]
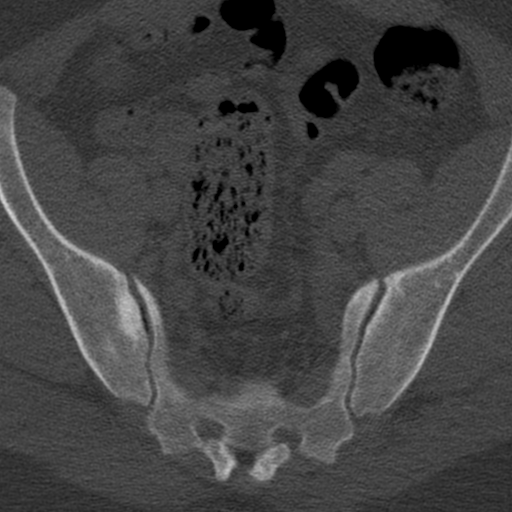
[im 28/122  bone]
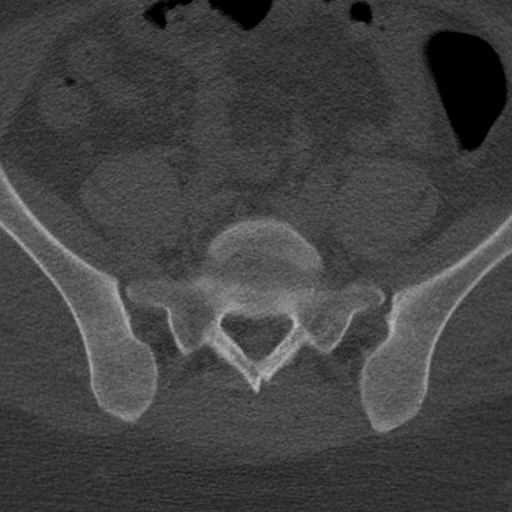
[im 38/122  bone]
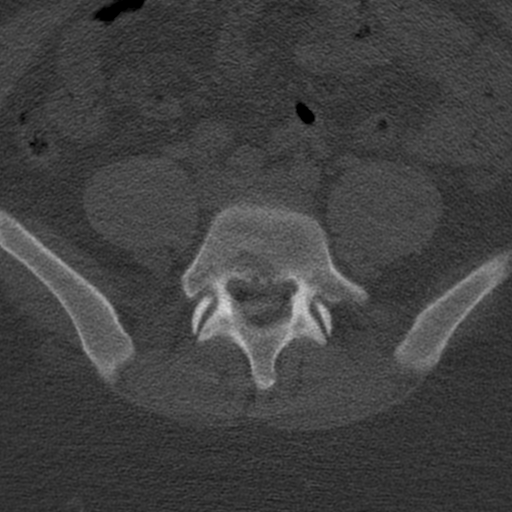
[im 56/122  bone]
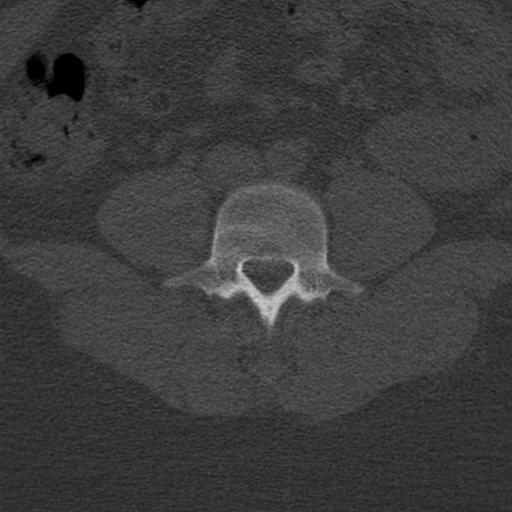
[im 66/122  soft-tissue]
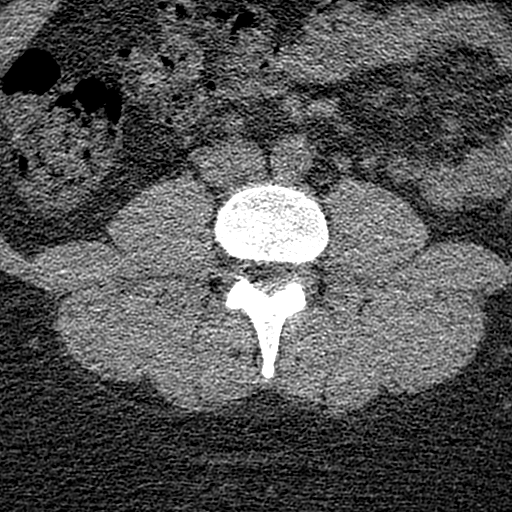
[im 66/122  bone]
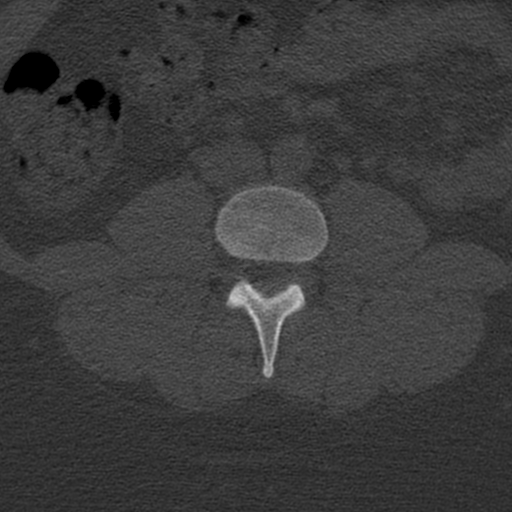
[im 84/122  bone]
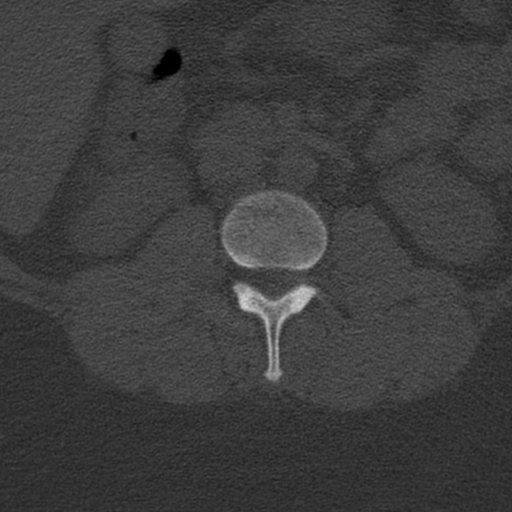
[im 94/122  bone]
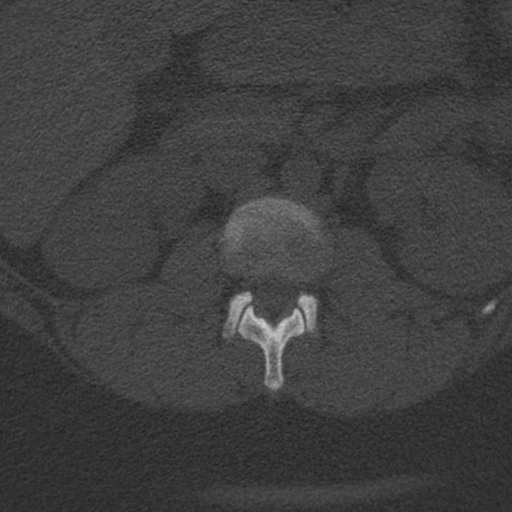
[im 112/122  bone]
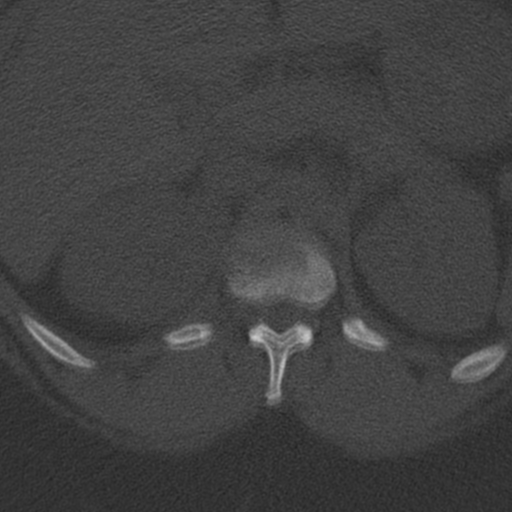

[Series 604: coronal soft tissue · coronal · 0.48mm/px · 3 of 48 slices shown]
[im 10/48  bone]
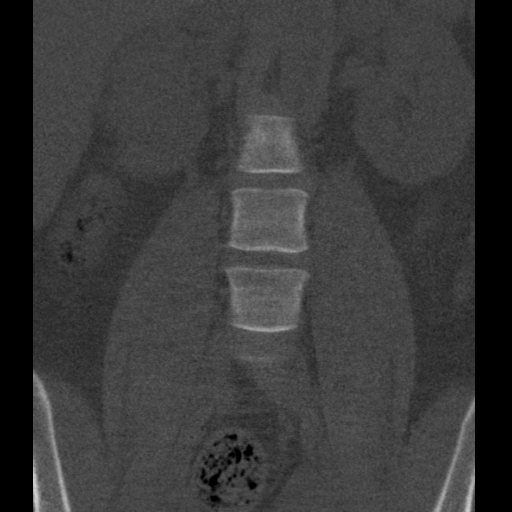
[im 19/48  bone]
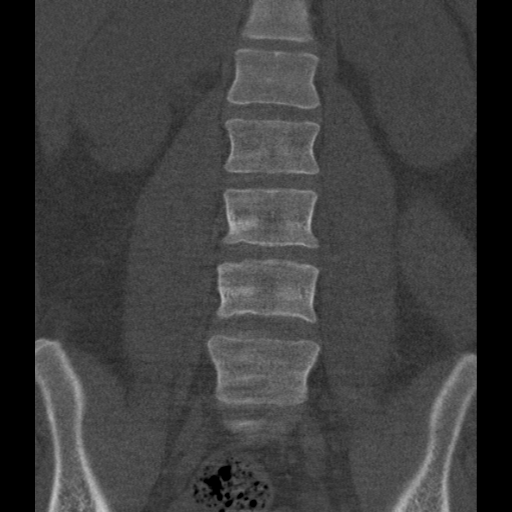
[im 29/48  bone]
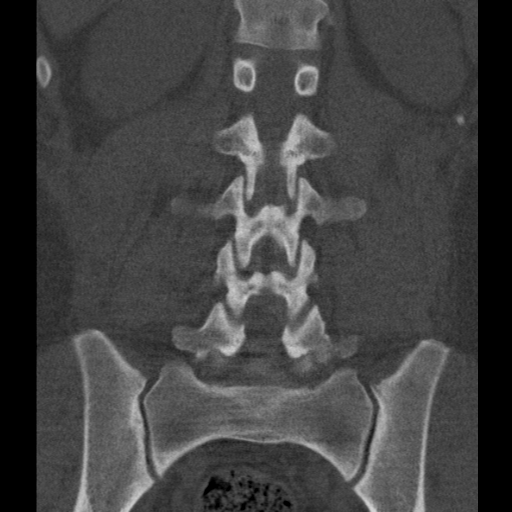

[Series 605: sagittal soft tissue · sagittal · 0.48mm/px · 5 of 48 slices shown, 6 images]
[im 16/48  bone]
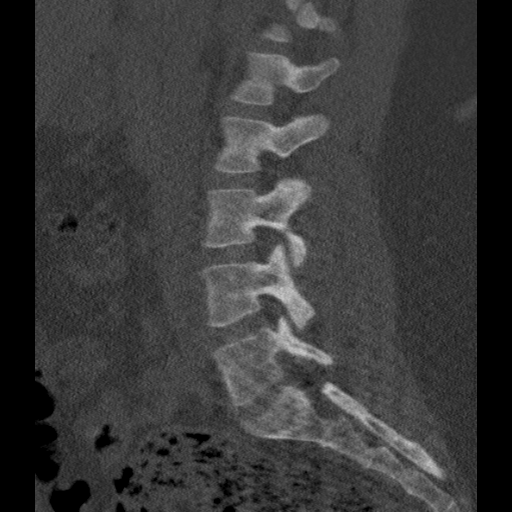
[im 20/48  bone]
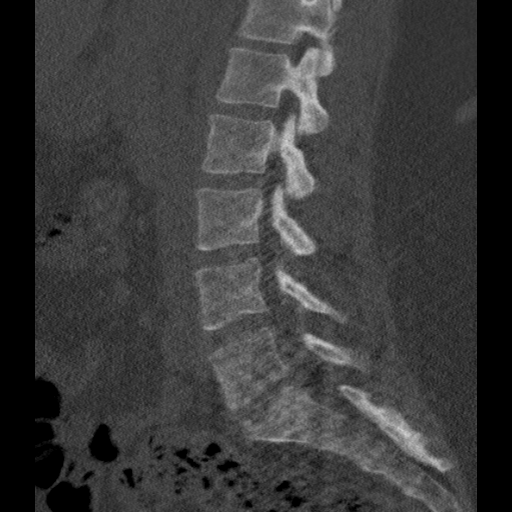
[im 24/48  soft-tissue]
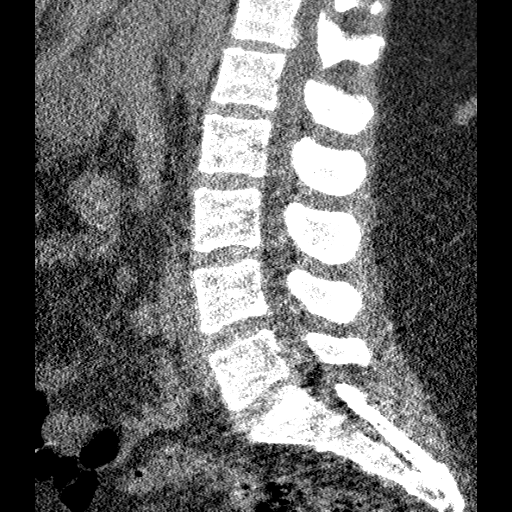
[im 24/48  bone]
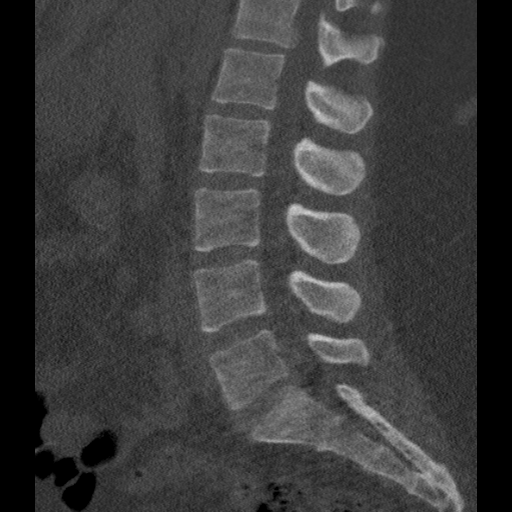
[im 28/48  bone]
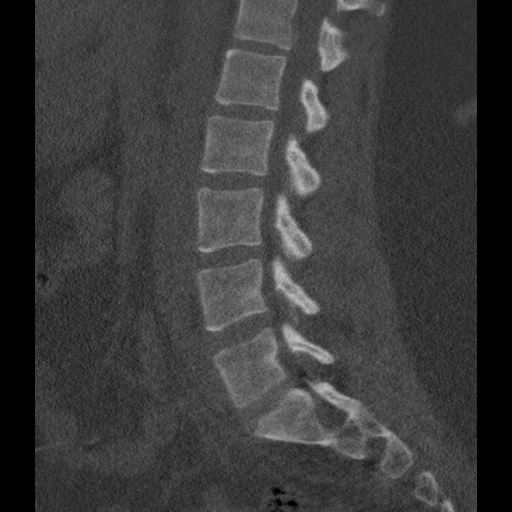
[im 32/48  bone]
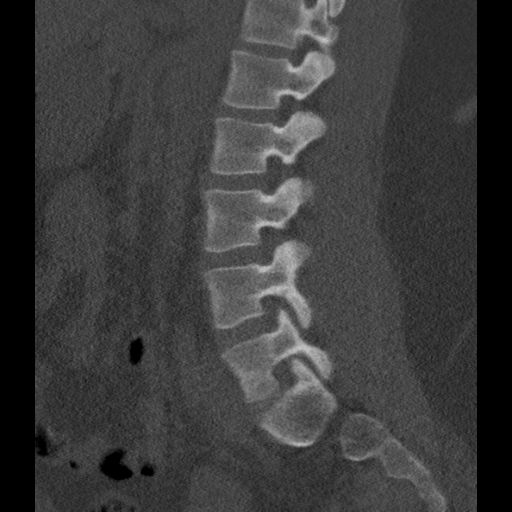

[16 of 33 positions shown; findings below may reference images not displayed]

FINDINGS: Normal alignment.  No fracture or mass lesion.  Disc
spaces are maintained.

Image quality is suboptimal due to large patient size.  The study
is not adequate to evaluate for disc protrusion or spinal stenosis.
MRI may be helpful if the patient has radiculopathy.

No significant degenerative change or spurring.  SI joints are
intact.
IMPRESSION: No acute bony abnormality.  MRI suggested if there is concern of
disc disease.

## 2017-06-17 ENCOUNTER — Encounter (HOSPITAL_COMMUNITY): Payer: Self-pay | Admitting: Emergency Medicine

## 2017-06-17 ENCOUNTER — Emergency Department (HOSPITAL_COMMUNITY)
Admission: EM | Admit: 2017-06-17 | Discharge: 2017-06-17 | Disposition: A | Discharge status: Home / Self Care | Payer: BLUE CROSS/BLUE SHIELD | Attending: Emergency Medicine | Admitting: Emergency Medicine

## 2017-06-17 DIAGNOSIS — E119 Type 2 diabetes mellitus without complications: Secondary | ICD-10-CM | POA: Diagnosis not present

## 2017-06-17 DIAGNOSIS — Z79899 Other long term (current) drug therapy: Secondary | ICD-10-CM | POA: Insufficient documentation

## 2017-06-17 DIAGNOSIS — R220 Localized swelling, mass and lump, head: Secondary | ICD-10-CM | POA: Diagnosis present

## 2017-06-17 DIAGNOSIS — T783XXA Angioneurotic edema, initial encounter: Secondary | ICD-10-CM | POA: Diagnosis not present

## 2017-06-17 LAB — BASIC METABOLIC PANEL
ANION GAP: 10 (ref 5–15)
BUN: 7 mg/dL (ref 6–20)
CHLORIDE: 100 mmol/L — AB (ref 101–111)
CO2: 20 mmol/L — AB (ref 22–32)
Calcium: 9.6 mg/dL (ref 8.9–10.3)
Creatinine, Ser: 0.65 mg/dL (ref 0.44–1.00)
GFR calc non Af Amer: >60 mL/min (ref >60)
Glucose, Bld: 236 mg/dL — ABNORMAL HIGH (ref 65–99)
Potassium: 4.3 mmol/L (ref 3.5–5.1)
Sodium: 130 mmol/L — ABNORMAL LOW (ref 135–145)

## 2017-06-17 LAB — CBC WITH DIFFERENTIAL/PLATELET
Basophils Absolute: 0 10*3/uL (ref 0.0–0.1)
Basophils Relative: 0 %
Eosinophils Absolute: 0 10*3/uL (ref 0.0–0.7)
Eosinophils Relative: 0 %
HEMATOCRIT: 39.6 % (ref 36.0–46.0)
Hemoglobin: 13.4 g/dL (ref 12.0–15.0)
LYMPHS ABS: 0.8 10*3/uL (ref 0.7–4.0)
Lymphocytes Relative: 6 %
MCH: 27.3 pg (ref 26.0–34.0)
MCHC: 33.8 g/dL (ref 30.0–36.0)
MCV: 80.8 fL (ref 78.0–100.0)
MONOS PCT: 1 %
Monocytes Absolute: 0.2 10*3/uL (ref 0.1–1.0)
NEUTROS ABS: 12.8 10*3/uL — AB (ref 1.7–7.7)
NEUTROS PCT: 93 %
Platelets: 302 10*3/uL (ref 150–400)
RBC: 4.9 MIL/uL (ref 3.87–5.11)
RDW: 13.5 % (ref 11.5–15.5)
WBC: 13.7 10*3/uL — ABNORMAL HIGH (ref 4.0–10.5)

## 2017-06-17 MED ORDER — DIPHENHYDRAMINE HCL 25 MG PO CAPS
50.0000 mg | ORAL_CAPSULE | Freq: Once | ORAL | Status: AC
  Administered 2017-06-17: 50 mg via ORAL
  Filled 2017-06-17: qty 2

## 2017-06-17 MED ORDER — PREDNISONE 20 MG PO TABS: 20.0000 mg | ORAL_TABLET | Freq: Two times a day (BID) | ORAL | 0 refills | Status: AC

## 2017-06-17 MED ORDER — DILTIAZEM HCL 100 MG IV SOLR: 5.0000 mg/h | INTRAVENOUS | Status: DC

## 2017-06-17 MED ORDER — DIPHENHYDRAMINE HCL 50 MG/ML IJ SOLN
25.0000 mg | Freq: Once | INTRAMUSCULAR | Status: AC
  Administered 2017-06-17: 25 mg via INTRAVENOUS
  Filled 2017-06-17: qty 1

## 2017-06-17 MED ORDER — METHYLPREDNISOLONE SODIUM SUCC 125 MG IJ SOLR
125.0000 mg | Freq: Once | INTRAMUSCULAR | Status: AC
  Administered 2017-06-17: 125 mg via INTRAVENOUS
  Filled 2017-06-17: qty 2

## 2017-06-17 MED ORDER — FAMOTIDINE IN NACL 20-0.9 MG/50ML-% IV SOLN
20.0000 mg | Freq: Once | INTRAVENOUS | Status: AC
  Administered 2017-06-17: 20 mg via INTRAVENOUS
  Filled 2017-06-17: qty 50

## 2017-06-17 MED ORDER — PREDNISONE 10 MG PO TABS: 20.0000 mg | ORAL_TABLET | Freq: Two times a day (BID) | ORAL | 0 refills | Status: DC

## 2017-06-17 MED ORDER — DILTIAZEM LOAD VIA INFUSION: 10.0000 mg | Freq: Once | INTRAVENOUS | Status: DC

## 2017-06-17 NOTE — ED Notes (Signed)
Upper lip now noted as minimal swelling. Same reported to Dr. Effie Shy. Pt NAD and resp even and non labored.

## 2017-06-17 NOTE — ED Provider Notes (Signed)
[facility]-EMERGENCY DEPT Provider Note   CSN: [id number] Arrival date & time: 06/17/17  0153     History   Chief Complaint Chief Complaint  Patient presents with  . Oral Swelling    HPI Jennifer Cox is a 31 y.o. female.  The patient is here for evaluation of swelling of the lower lip which started early this morning.  The swelling has progressed since arrival in the emergency department.  She relates the swelling as possibly being related to taking a single dose of Nexium.  She is prescribed lisinopril, but has not taken it in 2 weeks time.  She denies shortness of breath, weakness or dizziness.  No prior similar problem.  There are no other known modifying factors.  HPI  Past Medical History:  Diagnosis Date  . Diabetes mellitus without complication (HCC)     There are no active problems to display for this patient.   History reviewed. No pertinent surgical history.  OB History    No data available       Home Medications    Prior to Admission medications   Medication Sig Start Date End Date Taking? Authorizing Provider  atorvastatin (LIPITOR) 10 MG tablet Take 10 mg by mouth daily. 03/04/17 03/04/18 Yes [provider]  esomeprazole (NEXIUM) 20 MG capsule Take 20 mg by mouth daily at 12 noon.   Yes [provider]  Exenatide ER 2 MG/0.85ML AUIJ Inject 2 mg into the skin once a week. 03/04/17  Yes [provider]  phentermine (ADIPEX-P) 37.5 MG tablet Take 37.5 mg by mouth daily. 03/02/17  Yes [provider]  ibuprofen (ADVIL,MOTRIN) 600 MG tablet Take 1 tablet (600 mg total) by mouth every 6 (six) hours as needed for pain. Patient not taking: Reported on 06/17/2017 03/13/13   Loren Racer, MD  lisinopril (PRINIVIL,ZESTRIL) 20 MG tablet Take 0.5 tablets (10 mg total) by mouth daily. 03/13/13   Loren Racer, MD  predniSONE (DELTASONE) 10 MG tablet Take 2 tablets (20 mg total) by mouth 2 (two) times daily. 06/17/17   Mancel Bale, MD   traMADol (ULTRAM) 50 MG tablet Take 1 tablet (50 mg total) by mouth every 6 (six) hours as needed for pain. Patient not taking: Reported on 06/17/2017 03/13/13   Loren Racer, MD    Family History No family history on file.  Social History Social History  Substance Use Topics  . Smoking status: Not on file  . Smokeless tobacco: Not on file  . Alcohol use Not on file     Allergies   Patient has no known allergies.   Review of Systems Review of Systems  All other systems reviewed and are negative.    Physical Exam Updated Vital Signs BP 120/62   Pulse 89   Temp 98.1 F (36.7 C) (Oral)   Resp 18   Ht  (1.676 m)   Wt (!) 137 kg (302 lb)   SpO2 98%   BMI 48.74 kg/m   Physical Exam  Constitutional: She is oriented to person, place, and time. She appears well-developed and well-nourished. No distress.  HENT:  Head: Normocephalic and atraumatic.  Mild angioedema, lower lip, bilaterally.  No intraoral swelling, airway is intact.  Eyes: Pupils are equal, round, and reactive to light. Conjunctivae and EOM are normal.  Neck: Normal range of motion and phonation normal. Neck supple.  Cardiovascular: Normal rate and regular rhythm.   Pulmonary/Chest: Effort normal and breath sounds normal. No stridor. No respiratory distress. She has no  wheezes. She exhibits no tenderness.  Musculoskeletal: Normal range of motion.  Neurological: She is alert and oriented to person, place, and time. She exhibits normal muscle tone.  Skin: Skin is warm and dry.  Psychiatric: She has a normal mood and affect. Her behavior is normal. Judgment and thought content normal.  Nursing note and vitals reviewed.    ED Treatments / Results  Labs (all labs ordered are listed, but only abnormal results are displayed) Labs Reviewed  BASIC METABOLIC PANEL - Abnormal; Notable for the following:       Result Value   Sodium 130 (*)    Chloride 100 (*)    CO2 20 (*)    Glucose, Bld 236 (*)     All other components within normal limits  CBC WITH DIFFERENTIAL/PLATELET - Abnormal; Notable for the following:    WBC 13.7 (*)    Neutro Abs 12.8 (*)    All other components within normal limits    EKG  EKG Interpretation None       Radiology No results found.  Procedures Procedures (including critical care time)  Medications Ordered in ED Medications  diphenhydrAMINE (BENADRYL) capsule 50 mg (50 mg Oral Given 06/17/17 0207)  famotidine (PEPCID) IVPB 20 mg premix (0 mg Intravenous Stopped 06/17/17 0905)  diphenhydrAMINE (BENADRYL) injection 25 mg (25 mg Intravenous Given 06/17/17 0757)  methylPREDNISolone sodium succinate (SOLU-MEDROL) 125 mg/2 mL injection 125 mg (125 mg Intravenous Given 06/17/17 0755)     Initial Impression / Assessment and Plan / ED Course  I have reviewed the triage vital signs and the nursing notes.  Pertinent labs & imaging results that were available during my care of the patient were reviewed by me and considered in my medical decision making (see chart for details).      Patient Vitals for the past 24 hrs:  BP Temp Temp src Pulse Resp SpO2 Height Weight  06/17/17 1415 - - - 89 - 98 % - -  06/17/17 1400 120/62 - - 93 18 96 % - -  06/17/17 1345 - - - 92 - 97 % - -  06/17/17 1330 128/68 - - 89 16 97 % - -  06/17/17 1315 - - - 87 - 95 % - -  06/17/17 1300 117/71 - - 94 16 97 % - -  06/17/17 1245 - - - 95 - 96 % - -  06/17/17 1230 127/75 - - 99 - 97 % - -  06/17/17 1215 - - - 95 - 98 % - -  06/17/17 1200 124/74 - - 93 - 94 % - -  06/17/17 1030 104/62 - - 91 18 97 % - -  06/17/17 1000 123/77 - - 93 - 96 % - -  06/17/17 0930 112/72 - - 91 18 98 % - -  06/17/17 0900 (!) 109/97 - - 83 - 98 % - -  06/17/17 0830 98/71 - - 85 - 95 % - -  06/17/17 0702 124/68 98.1 F (36.7 C) Oral 93 16 98 % - -  06/17/17 0157 114/89 98.1 F (36.7 C) Oral (!) 107 16 99 %  (1.676 m) (!) 137 kg (302 lb)    10:23 AM Reevaluation with update and discussion.  After initial assessment and treatment, an updated evaluation reveals at this time after secondary treatment, her angioedema has progressed somewhat now with very mild bilateral upper lip swelling.  No stridor, no intraoral swelling.  Patient is hungry and thirsty. [name]  15: 05-patient has now been observed for about 5 hours after initiation of full treatment for angioedema.  At this time the lower lip swelling is almost entirely resolved.  There is mild residual upper lip swelling.  No respiratory distress, no stridor, normal mentation.   Final Clinical Impressions(s) / ED Diagnoses   Final diagnoses:  Angioedema, initial encounter    Angioedema, cause unclear, possibly Nexium is the only new medication.  Patient improved with treatment is stable for discharge.  Nursing Notes Reviewed/ Care Coordinated Applicable Imaging Reviewed Interpretation of Laboratory Data incorporated into ED treatment  The patient appears reasonably screened and/or stabilized for discharge and I doubt any other medical condition or other [facility] requiring further screening, evaluation, or treatment in the ED at this time prior to discharge.  Plan: Home Medications-continue treatment with H1 and H2 blockade, 5 days.  Avoid Nexium.; Home Treatments-rest, fluids; return here if the recommended treatment, does not improve the symptoms; Recommended follow up-PCP, as needed    New Prescriptions New Prescriptions   PREDNISONE (DELTASONE) 10 MG TABLET    Take 2 tablets (20 mg total) by mouth 2 (two) times daily.     Mancel Bale, MD 06/17/17 [phone]

## 2017-06-17 NOTE — ED Notes (Signed)
EDP aware of pt 

## 2017-06-17 NOTE — ED Notes (Signed)
New folds noted at lower lip to indicate very slightly less swelling. Minimal swelling stable at upper lip.

## 2017-06-17 NOTE — ED Notes (Signed)
Acuity changed to 3 based on fact pt has more swelling to lip than when I first triaged pt. Pt still denies SOB, trouble breathing, etc.

## 2017-06-17 NOTE — ED Notes (Signed)
Pt upper lip noted as more swollen but jaw less swollen. NAD and resp even and non labored.

## 2017-06-17 NOTE — ED Notes (Signed)
Pt state she understands instrucitons. Home stable with seady gait.

## 2017-06-17 NOTE — Discharge Instructions (Signed)
To continue the treatment for angioedema, take Benadryl 25 mg 4 times a day, and Pepcid 20 mg twice a day for 5 days.  If the Benadryl makes you too sleepy you can use Zyrtec or Claritin, once daily, instead of the Benadryl, 4 times a day.  Return here, if needed, for problems.

## 2017-06-17 NOTE — ED Notes (Signed)
Walked patient to the bathroom patient is resting with call bell in reach

## 2017-06-17 NOTE — ED Triage Notes (Signed)
Pt reports she was laying in bed and starting having itching to her inner thighs, she states hives were present. She then noticed swelling to her lower lip. Pt denies SOB, trouble breathing/swallow. No wheezing or CP. Verbal from [place] PA for 50 Benadryl
